# Patient Record
Sex: Male | Born: 1965 | Race: White | Hispanic: No | Marital: Married | Smoking: Former smoker
Health system: Southern US, Community
[De-identification: ages and names within clinical notes are randomized; demographics above are authoritative.]

## PROBLEM LIST (undated history)

## (undated) HISTORY — PX: HERNIA REPAIR: SHX51

---

## 2020-09-05 ENCOUNTER — Emergency Department: Payer: No Typology Code available for payment source

## 2020-09-05 ENCOUNTER — Emergency Department
Admission: EM | Admit: 2020-09-05 | Discharge: 2020-09-05 | Disposition: A | Discharge status: Home / Self Care | Payer: No Typology Code available for payment source | Attending: Emergency Medicine | Admitting: Emergency Medicine

## 2020-09-05 ENCOUNTER — Other Ambulatory Visit: Payer: Self-pay

## 2020-09-05 DIAGNOSIS — H532 Diplopia: Secondary | ICD-10-CM

## 2020-09-05 DIAGNOSIS — Z87891 Personal history of nicotine dependence: Secondary | ICD-10-CM | POA: Diagnosis not present

## 2020-09-05 DIAGNOSIS — R519 Headache, unspecified: Secondary | ICD-10-CM

## 2020-09-05 DIAGNOSIS — H538 Other visual disturbances: Secondary | ICD-10-CM | POA: Diagnosis not present

## 2020-09-05 DIAGNOSIS — H539 Unspecified visual disturbance: Secondary | ICD-10-CM

## 2020-09-05 DIAGNOSIS — R410 Disorientation, unspecified: Secondary | ICD-10-CM | POA: Diagnosis not present

## 2020-09-05 LAB — CBC
HCT: 41.2 % (ref 39.0–52.0)
Hemoglobin: 13.8 g/dL (ref 13.0–17.0)
MCH: 30.6 pg (ref 26.0–34.0)
MCHC: 33.5 g/dL (ref 30.0–36.0)
MCV: 91.4 fL (ref 80.0–100.0)
Platelets: 174 10*3/uL (ref 150–400)
RBC: 4.51 MIL/uL (ref 4.22–5.81)
RDW: 13.1 % (ref 11.5–15.5)
WBC: 4 10*3/uL (ref 4.0–10.5)
nRBC: 0 % (ref 0.0–0.2)

## 2020-09-05 LAB — DIFFERENTIAL
Abs Immature Granulocytes: 0.02 10*3/uL (ref 0.00–0.07)
Basophils Absolute: 0.1 10*3/uL (ref 0.0–0.1)
Basophils Relative: 2 %
Eosinophils Absolute: 0.1 10*3/uL (ref 0.0–0.5)
Eosinophils Relative: 3 %
Immature Granulocytes: 1 %
Lymphocytes Relative: 34 %
Lymphs Abs: 1.4 10*3/uL (ref 0.7–4.0)
Monocytes Absolute: 0.5 10*3/uL (ref 0.1–1.0)
Monocytes Relative: 13 %
Neutro Abs: 2 10*3/uL (ref 1.7–7.7)
Neutrophils Relative %: 47 %

## 2020-09-05 LAB — COMPREHENSIVE METABOLIC PANEL
ALT: 18 U/L (ref 0–44)
AST: 27 U/L (ref 15–41)
Albumin: 4.3 g/dL (ref 3.5–5.0)
Alkaline Phosphatase: 43 U/L (ref 38–126)
Anion gap: 8 (ref 5–15)
BUN: 13 mg/dL (ref 6–20)
CO2: 24 mmol/L (ref 22–32)
Calcium: 8.4 mg/dL — ABNORMAL LOW (ref 8.9–10.3)
Chloride: 102 mmol/L (ref 98–111)
Creatinine, Ser: 1.28 mg/dL — ABNORMAL HIGH (ref 0.61–1.24)
GFR, Estimated: >60 mL/min (ref >60)
Glucose, Bld: 77 mg/dL (ref 70–99)
Potassium: 3.9 mmol/L (ref 3.5–5.1)
Sodium: 134 mmol/L — ABNORMAL LOW (ref 135–145)
Total Bilirubin: 0.6 mg/dL (ref 0.3–1.2)
Total Protein: 7.2 g/dL (ref 6.5–8.1)

## 2020-09-05 LAB — CBG MONITORING, ED: Glucose-Capillary: 106 mg/dL — ABNORMAL HIGH (ref 70–99)

## 2020-09-05 LAB — APTT: aPTT: 29 seconds (ref 24–36)

## 2020-09-05 LAB — PROTIME-INR
INR: 1 (ref 0.8–1.2)
Prothrombin Time: 12.4 seconds (ref 11.4–15.2)

## 2020-09-05 MED ORDER — LORAZEPAM 2 MG/ML IJ SOLN
0.5000 mg | Freq: Once | INTRAMUSCULAR | Status: AC
  Administered 2020-09-05: 0.5 mg via INTRAVENOUS
  Filled 2020-09-05: qty 1

## 2020-09-05 MED ORDER — PROCHLORPERAZINE EDISYLATE 10 MG/2ML IJ SOLN
10.0000 mg | Freq: Once | INTRAMUSCULAR | Status: AC
  Administered 2020-09-05: 10 mg via INTRAVENOUS
  Filled 2020-09-05: qty 2

## 2020-09-05 MED ORDER — IOHEXOL 350 MG/ML SOLN
75.0000 mL | Freq: Once | INTRAVENOUS | Status: AC | PRN
  Administered 2020-09-05: 75 mL via INTRAVENOUS

## 2020-09-05 MED ORDER — DEXAMETHASONE SODIUM PHOSPHATE 10 MG/ML IJ SOLN
6.0000 mg | Freq: Once | INTRAMUSCULAR | Status: AC
  Administered 2020-09-05: 6 mg via INTRAVENOUS
  Filled 2020-09-05: qty 1

## 2020-09-05 MED ORDER — SODIUM CHLORIDE 0.9 % IV BOLUS
1000.0000 mL | Freq: Once | INTRAVENOUS | Status: AC
  Administered 2020-09-05: 1000 mL via INTRAVENOUS

## 2020-09-05 MED ORDER — SODIUM CHLORIDE 0.9% FLUSH
3.0000 mL | Freq: Once | INTRAVENOUS | Status: AC
Start: 2020-09-05 — End: 2020-09-05
  Administered 2020-09-05: 3 mL via INTRAVENOUS

## 2020-09-05 NOTE — ED Triage Notes (Addendum)
Pt via POV from home. Pt states he was driving with a sudden onset of double vision. R-sided facial numbness. No facial palsy or weakness on either side. Pt also c/o pressure in his R eye. LKW 1105. Denies pain or SOB. Pt is A&Ox4. Spoke with Dr. Darnelle Catalan, MD. CODE STROKE called.

## 2020-09-05 NOTE — Discharge Instructions (Addendum)
Please seek medical attention for any high fevers, chest pain, shortness of breath, change in behavior, persistent vomiting, bloody stool or any other new or concerning symptoms.  

## 2020-09-05 NOTE — Progress Notes (Signed)
   09/05/20 1209  Clinical Encounter Type  Visited With Patient  Visit Type Initial;Spiritual support;Social support  Referral From Nurse  Consult/Referral To Chaplain  Ch responded to Pg from ER. Pt was not in the room. Ch hung around to see if family was there. No family in hospital Ch will follow-up later.

## 2020-09-05 NOTE — ED Triage Notes (Signed)
FIRST NURSE: Pt reports sudden onset double vision that started while driving.  Pt states double vision has subsided, but that head now feels numb and he is having waves of dizziness.  Modified NIH in lobby is negative.

## 2020-09-05 NOTE — ED Notes (Signed)
Patient states he no longer has numbness on head. Just complains of slight headache.

## 2020-09-05 NOTE — ED Notes (Signed)
Code  stroke  called  to 333 

## 2020-09-05 NOTE — ED Provider Notes (Signed)
MRI negative for any acute abnormalities. Patient states that he feels better at this time. No longer having any vision change or headache. Will plan on discharge. Will give neurology follow up information.   Phineas Semen, MD 09/05/20 1626

## 2020-09-05 NOTE — ED Provider Notes (Signed)
[facility] Emergency Department Provider Note   ____________________________________________   First MD Initiated Contact with Patient 09/05/20 1229     (approximate)  I have reviewed the triage vital signs and the nursing notes.   HISTORY  Chief Complaint Code Stroke    HPI Douglas Schwartz is a 54 y.o. male who reports he was driving back to [place] with his wife.  He developed a headache and some right-sided facial funny feeling with double vision.  The double vision lasted about 2 minutes and resolved.  Patient did not have any trouble with moving his arms or legs or speaking.  He also had some pressure in his right eye.  On arrival here he did not have any double vision or any focal complaints except for the pressure behind his right eye and headache on the right side.  The headache was not the worst of his life.         History reviewed. No pertinent past medical history.  There are no problems to display for this patient.     Prior to Admission medications   Not on File    Allergies Patient has no known allergies.  No family history on file.  Social History Social History   Tobacco Use  . Smoking status: Former Smoker  Substance Use Topics  . Alcohol use: Not Currently  . Drug use: Never    Review of Systems  Constitutional: No fever/chills Eyes: Currently no visual changes. ENT: No sore throat. Cardiovascular: Denies chest pain. Respiratory: Denies shortness of breath. Gastrointestinal: No abdominal pain.  No nausea, no vomiting.  No diarrhea.  No constipation. Genitourinary: Negative for dysuria. Musculoskeletal: Negative for back pain. Skin: Negative for rash. Neurological: Negative for focal weakness   ____________________________________________   PHYSICAL EXAM:  VITAL SIGNS: ED Triage Vitals  Enc Vitals Group     BP 09/05/20 1200 134/87     Pulse Rate 09/05/20 1200 100     Resp 09/05/20 1200 18      Temp 09/05/20 1200 97.9 F (36.6 C)     Temp Source 09/05/20 1200 Oral     SpO2 09/05/20 1200 100 %     Weight 09/05/20 1230 203 lb 8 oz (92.3 kg)     Height 09/05/20 1230 6\' 2"  (1.88 m)     Head Circumference --      Peak Flow --      Pain Score 09/05/20 1225 0     Pain Loc --      Pain Edu? --      Excl. in GC? --     Constitutional: Alert and oriented. Well appearing and in no acute distress. Eyes: Conjunctivae are normal. PERRL. EOMI. fundi look normal Head: Atraumatic. Nose: No congestion/rhinnorhea. Mouth/Throat: Mucous membranes are moist.  Oropharynx non-erythematous. Neck: No stridor.   Cardiovascular: Normal rate, regular rhythm. Grossly normal heart sounds.  Good peripheral circulation. Respiratory: Normal respiratory effort.  No retractions. Lungs CTAB. Gastrointestinal: Soft and nontender. No distention. No abdominal bruits. No CVA tenderness. Musculoskeletal: No lower extremity tenderness nor edema.  Neurologic:  Normal speech and language. No gross focal neurologic deficits are appreciated.  Cranial nerves II through XII are intact although visual fields were not checked cerebellar finger-nose heel-to-shin and rapid alternating movements and hands are normal motor strength is 5/5 throughout patient does not report any numbness Skin:  Skin is warm, dry and intact. No rash noted.   ____________________________________________   LABS (all labs ordered  are listed, but only abnormal results are displayed)  Labs Reviewed  COMPREHENSIVE METABOLIC PANEL - Abnormal; Notable for the following components:      Result Value   Sodium 134 (*)    Creatinine, Ser 1.28 (*)    Calcium 8.4 (*)    All other components within normal limits  CBG MONITORING, ED - Abnormal; Notable for the following components:   Glucose-Capillary 106 (*)    All other components within normal limits  PROTIME-INR  APTT  CBC  DIFFERENTIAL  I-STAT CREATININE, ED  CBG MONITORING, ED    ____________________________________________  EKG EKG read interpreted by me shows normal sinus rhythm rate of 72 right bundle branch block no acute disease  ____________________________________________  RADIOLOGY Jill Poling, personally viewed and evaluated these images (plain radiographs) as part of my medical decision making, as well as reviewing the written report by the radiologist.  ED MD interpretation: CT and CT angio of the head were read as no acute disease by radiology I reviewed the films  Official radiology report(s): CT Angio Head W or Wo Contrast  Result Date: 09/05/2020 CLINICAL DATA:  Sudden onset of double vision and right-sided facial numbness. EXAM: CT ANGIOGRAPHY HEAD AND NECK TECHNIQUE: Multidetector CT imaging of the head and neck was performed using the standard protocol during bolus administration of intravenous contrast. Multiplanar CT image reconstructions and MIPs were obtained to evaluate the vascular anatomy. Carotid stenosis measurements (when applicable) are obtained utilizing NASCET criteria, using the distal internal carotid diameter as the denominator. CONTRAST:  47mL OMNIPAQUE IOHEXOL 350 MG/ML SOLN COMPARISON:  Head CT earlier same day FINDINGS: CTA NECK FINDINGS Aortic arch: Normal Right carotid system: Normal.  No bifurcation disease. Left carotid system: Normal.  No bifurcation disease. Vertebral arteries: Normal. No origin stenosis. No evidence of dissection. Left vertebral artery arises directly from the arch. Skeleton: Normal Other neck: No mass or adenopathy. Upper chest: Normal Review of the MIP images confirms the above findings CTA HEAD FINDINGS Anterior circulation: Both internal carotid arteries widely patent through the skull base and siphon regions. The anterior and middle cerebral vessels are patent without proximal stenosis, aneurysm or vascular malformation. No large or medium vessel occlusion. Posterior circulation: Both vertebral  arteries widely patent to the basilar. No basilar stenosis. Posterior circulation branch vessels are normal. Left PCA takes fetal origin from the anterior circulation. Venous sinuses: Patent and normal. Anatomic variants: None significant. Review of the MIP images confirms the above findings IMPRESSION: Normal examination. No atherosclerotic disease. No large or medium vessel occlusion. These results were called by telephone at the time of interpretation on 09/05/2020 at 12:22 pm to provider Dr. Darnelle Catalan, who verbally acknowledged these results. Electronically Signed   By: Paulina Fusi M.D.   On: 09/05/2020 12:35   CT Angio Neck W and/or Wo Contrast  Result Date: 09/05/2020 CLINICAL DATA:  Sudden onset of double vision and right-sided facial numbness. EXAM: CT ANGIOGRAPHY HEAD AND NECK TECHNIQUE: Multidetector CT imaging of the head and neck was performed using the standard protocol during bolus administration of intravenous contrast. Multiplanar CT image reconstructions and MIPs were obtained to evaluate the vascular anatomy. Carotid stenosis measurements (when applicable) are obtained utilizing NASCET criteria, using the distal internal carotid diameter as the denominator. CONTRAST:  14mL OMNIPAQUE IOHEXOL 350 MG/ML SOLN COMPARISON:  Head CT earlier same day FINDINGS: CTA NECK FINDINGS Aortic arch: Normal Right carotid system: Normal.  No bifurcation disease. Left carotid system: Normal.  No bifurcation disease.  Vertebral arteries: Normal. No origin stenosis. No evidence of dissection. Left vertebral artery arises directly from the arch. Skeleton: Normal Other neck: No mass or adenopathy. Upper chest: Normal Review of the MIP images confirms the above findings CTA HEAD FINDINGS Anterior circulation: Both internal carotid arteries widely patent through the skull base and siphon regions. The anterior and middle cerebral vessels are patent without proximal stenosis, aneurysm or vascular malformation. No large or  medium vessel occlusion. Posterior circulation: Both vertebral arteries widely patent to the basilar. No basilar stenosis. Posterior circulation branch vessels are normal. Left PCA takes fetal origin from the anterior circulation. Venous sinuses: Patent and normal. Anatomic variants: None significant. Review of the MIP images confirms the above findings IMPRESSION: Normal examination. No atherosclerotic disease. No large or medium vessel occlusion. These results were called by telephone at the time of interpretation on 09/05/2020 at 12:22 pm to provider Dr. Darnelle CatalanMalinda, who verbally acknowledged these results. Electronically Signed   By: Paulina FusiMark  Shogry M.D.   On: 09/05/2020 12:35   MR BRAIN WO CONTRAST  Result Date: 09/05/2020 CLINICAL DATA:  Sudden onset of confusion, blurry vision, and headache. EXAM: MRI HEAD WITHOUT CONTRAST TECHNIQUE: Multiplanar, multiecho pulse sequences of the brain and surrounding structures were obtained without intravenous contrast. COMPARISON:  Head CT and CTA 09/05/2020 FINDINGS: Brain: There is no evidence of an acute infarct, intracranial hemorrhage, mass, midline shift, or extra-axial fluid collection. The ventricles and sulci are within normal limits for age. The brain is normal in signal. Vascular: Major intracranial vascular flow voids are preserved. Skull and upper cervical spine: Unremarkable bone marrow signal. Sinuses/Orbits: Unremarkable orbits. Paranasal sinuses and mastoid air cells are clear. Other: None. IMPRESSION: Negative brain MRI. Electronically Signed   By: Sebastian AcheAllen  Grady M.D.   On: 09/05/2020 15:28   CT HEAD CODE STROKE WO CONTRAST  Result Date: 09/05/2020 CLINICAL DATA:  Code stroke. Sudden onset of double vision in right facial numbness while driving. EXAM: CT HEAD WITHOUT CONTRAST TECHNIQUE: Contiguous axial images were obtained from the base of the skull through the vertex without intravenous contrast. COMPARISON:  None. FINDINGS: Brain: The brain has normal  appearance without evidence of old or acute infarction, mass lesion, hemorrhage, hydrocephalus or extra-axial collection. Vascular: No abnormal vascular finding. Skull: Negative Sinuses/Orbits: Clear/normal Other: None ASPECTS (Alberta Stroke Program Early CT Score) - Ganglionic level infarction (caudate, lentiform nuclei, internal capsule, insula, M1-M3 cortex): 7 - Supraganglionic infarction (M4-M6 cortex): 3 Total score (0-10 with 10 being normal): 10 IMPRESSION: 1. Normal head CT. 2. ASPECTS is 10. 3. These results were called by telephone at the time of interpretation on 09/05/2020 at 12:23 pm to provider [facility] K[facility]ss Frese , who verbally acknowledged these results. Electronically Signed   By: Paulina FusiMark  Shogry M.D.   On: 09/05/2020 12:29    ____________________________________________   PROCEDURES  Procedure(s) performed (including Critical Care):  Procedures   ____________________________________________   INITIAL IMPRESSION / ASSESSMENT AND PLAN / ED COURSE  Since code stroke was called Dr. Earlean PolkaSolik when came down to see the patient.  He evaluated the patient felt that this was not a stroke and if the MRI was negative the patient could go home. Patient signed out to oncoming physician pending MRI report             ____________________________________________   FINAL CLINICAL IMPRESSION(S) / ED DIAGNOSES  Final diagnoses:  Vision changes  Nonintractable headache, unspecified chronicity pattern, unspecified headache type  Episode of confusion     ED Discharge  Orders    None      *Please note:  NAOMI FITTON was evaluated in Emergency Department on 09/05/2020 for the symptoms described in the history of present illness. He was evaluated in the context of the global COVID-19 pandemic, which necessitated consideration that the patient might be at risk for infection with the SARS-CoV-2 virus that causes COVID-19. Institutional protocols and algorithms that pertain to the  evaluation of patients at risk for COVID-19 are in a state of rapid change based on information released by regulatory bodies including the CDC and federal and state organizations. These policies and algorithms were followed during the patient's care in the ED.  Some ED evaluations and interventions may be delayed as a result of limited staffing during and the pandemic.*   Note:  This document was prepared using Dragon voice recognition software and may include unintentional dictation errors.    Arnaldo Natal, MD 09/05/20 1630

## 2020-09-05 NOTE — Progress Notes (Signed)
CODE STROKE- PHARMACY COMMUNICATION   Time CODE STROKE called/page received:1211  Time response to CODE STROKE was made in person: 1225  Time Stroke Kit retrieved from [place] (only if needed):N/A  Name of Provider/Nurse contacted:Olivia, RN  Renda Rolls, PharmD, [facility] 09/05/2020 12:41 PM

## 2020-09-05 NOTE — Consult Note (Signed)
Reason for Consult: headache, double vision  Requesting Physician: Dr. Darnelle Catalan   CC: headache, double vision     HPI: Douglas Schwartz is an 54 y.o. male wit no past medical hx and is not on any medications. Pt was driving with his wife from [place] to [place] and at 11 AM had period of confusion, blurry vision and pressure like headache that is unusual for him. No focal deficits seen on exam.   History reviewed. No pertinent past medical history.  No family history on file.  Social History:  reports that he has quit smoking. He does not have any smokeless tobacco history on file. He reports previous alcohol use. He reports that he does not use drugs.  No Known Allergies  Medications: I have reviewed the patient's current medications.  ROS: History obtained from the patient  General ROS: negative for - chills, fatigue, fever, night sweats, weight gain or weight loss Psychological ROS: negative for - behavioral disorder, hallucinations, memory difficulties, mood swings or suicidal ideation Ophthalmic ROS: negative for - blurry vision, double vision, eye pain or loss of vision ENT ROS: negative for - epistaxis, nasal discharge, oral lesions, sore throat, tinnitus or vertigo Allergy and Immunology ROS: negative for - hives or itchy/watery eyes Hematological and Lymphatic ROS: negative for - bleeding problems, bruising or swollen lymph nodes Endocrine ROS: negative for - galactorrhea, hair pattern changes, polydipsia/polyuria or temperature intolerance Respiratory ROS: negative for - cough, hemoptysis, shortness of breath or wheezing Cardiovascular ROS: negative for - chest pain, dyspnea on exertion, edema or irregular heartbeat Gastrointestinal ROS: negative for - abdominal pain, diarrhea, hematemesis, nausea/vomiting or stool incontinence Genito-Urinary ROS: negative for - dysuria, hematuria, incontinence or urinary frequency/urgency Musculoskeletal ROS: negative for - joint  swelling or muscular weakness Neurological ROS: as noted in HPI Dermatological ROS: negative for rash and skin lesion changes  Physical Examination: Height 6\' 2"  (1.88 m), weight 92.3 kg.    Neurological Examination   Mental Status: Alert, oriented, thought content appropriate.  Speech fluent without evidence of aphasia.  Able to follow 3 step commands without difficulty. Cranial Nerves: II: Discs flat bilaterally; Visual fields grossly normal, pupils equal, round, reactive to light and accommodation III,IV, VI: ptosis not present, extra-ocular motions intact bilaterally V,VII: smile symmetric, facial light touch sensation normal bilaterally VIII: hearing normal bilaterally IX,X: gag reflex present XI: bilateral shoulder shrug XII: midline tongue extension Motor: Right : Upper extremity   5/5    Left:     Upper extremity   5/5  Lower extremity   5/5     Lower extremity   5/5 Tone and bulk:normal tone throughout; no atrophy noted Sensory: Pinprick and light touch intact throughout, bilaterally Deep Tendon Reflexes: 2+ and symmetric throughout Plantars: Right: downgoing   Left: downgoing Cerebellar: normal finger-to-nose, normal rapid alternating movements and normal heel-to-shin test       Laboratory Studies:   Basic Metabolic Panel: No results for input(s): NA, K, CL, CO2, GLUCOSE, BUN, CREATININE, CALCIUM, MG, PHOS in the last 168 hours.  Liver Function Tests: No results for input(s): AST, ALT, ALKPHOS, BILITOT, PROT, ALBUMIN in the last 168 hours. No results for input(s): LIPASE, AMYLASE in the last 168 hours. No results for input(s): AMMONIA in the last 168 hours.  CBC: Recent Labs  Lab 09/05/20 1232  WBC 4.0  NEUTROABS 2.0  HGB 13.8  HCT 41.2  MCV 91.4  PLT 174    Cardiac Enzymes: No results for input(s): CKTOTAL, CKMB, CKMBINDEX, TROPONINI  in the last 168 hours.  BNP: Invalid input(s): POCBNP  CBG: Recent Labs  Lab 09/05/20 1209  GLUCAP 106*     Microbiology: No results found for this or any previous visit.  Coagulation Studies: No results for input(s): LABPROT, INR in the last 72 hours.  Urinalysis: No results for input(s): COLORURINE, LABSPEC, PHURINE, GLUCOSEU, HGBUR, BILIRUBINUR, KETONESUR, PROTEINUR, UROBILINOGEN, NITRITE, LEUKOCYTESUR in the last 168 hours.  Invalid input(s): APPERANCEUR  Lipid Panel:  No results found for: CHOL, TRIG, HDL, CHOLHDL, VLDL, LDLCALC  HgbA1C: No results found for: HGBA1C  Urine Drug Screen:  No results found for: LABOPIA, COCAINSCRNUR, LABBENZ, AMPHETMU, THCU, LABBARB  Alcohol Level: No results for input(s): ETH in the last 168 hours.  Other results: EKG: normal EKG, normal sinus rhythm, unchanged from previous tracings.  Imaging: CT Angio Head W or Wo Contrast  Result Date: 09/05/2020 CLINICAL DATA:  Sudden onset of double vision and right-sided facial numbness. EXAM: CT ANGIOGRAPHY HEAD AND NECK TECHNIQUE: Multidetector CT imaging of the head and neck was performed using the standard protocol during bolus administration of intravenous contrast. Multiplanar CT image reconstructions and MIPs were obtained to evaluate the vascular anatomy. Carotid stenosis measurements (when applicable) are obtained utilizing NASCET criteria, using the distal internal carotid diameter as the denominator. CONTRAST:  55mL OMNIPAQUE IOHEXOL 350 MG/ML SOLN COMPARISON:  Head CT earlier same day FINDINGS: CTA NECK FINDINGS Aortic arch: Normal Right carotid system: Normal.  No bifurcation disease. Left carotid system: Normal.  No bifurcation disease. Vertebral arteries: Normal. No origin stenosis. No evidence of dissection. Left vertebral artery arises directly from the arch. Skeleton: Normal Other neck: No mass or adenopathy. Upper chest: Normal Review of the MIP images confirms the above findings CTA HEAD FINDINGS Anterior circulation: Both internal carotid arteries widely patent through the skull base and siphon  regions. The anterior and middle cerebral vessels are patent without proximal stenosis, aneurysm or vascular malformation. No large or medium vessel occlusion. Posterior circulation: Both vertebral arteries widely patent to the basilar. No basilar stenosis. Posterior circulation branch vessels are normal. Left PCA takes fetal origin from the anterior circulation. Venous sinuses: Patent and normal. Anatomic variants: None significant. Review of the MIP images confirms the above findings IMPRESSION: Normal examination. No atherosclerotic disease. No large or medium vessel occlusion. These results were called by telephone at the time of interpretation on 09/05/2020 at 12:22 pm to provider Dr. Darnelle Catalan, who verbally acknowledged these results. Electronically Signed   By: Paulina Fusi M.D.   On: 09/05/2020 12:35   CT Angio Neck W and/or Wo Contrast  Result Date: 09/05/2020 CLINICAL DATA:  Sudden onset of double vision and right-sided facial numbness. EXAM: CT ANGIOGRAPHY HEAD AND NECK TECHNIQUE: Multidetector CT imaging of the head and neck was performed using the standard protocol during bolus administration of intravenous contrast. Multiplanar CT image reconstructions and MIPs were obtained to evaluate the vascular anatomy. Carotid stenosis measurements (when applicable) are obtained utilizing NASCET criteria, using the distal internal carotid diameter as the denominator. CONTRAST:  68mL OMNIPAQUE IOHEXOL 350 MG/ML SOLN COMPARISON:  Head CT earlier same day FINDINGS: CTA NECK FINDINGS Aortic arch: Normal Right carotid system: Normal.  No bifurcation disease. Left carotid system: Normal.  No bifurcation disease. Vertebral arteries: Normal. No origin stenosis. No evidence of dissection. Left vertebral artery arises directly from the arch. Skeleton: Normal Other neck: No mass or adenopathy. Upper chest: Normal Review of the MIP images confirms the above findings CTA HEAD FINDINGS Anterior circulation: Both  internal  carotid arteries widely patent through the skull base and siphon regions. The anterior and middle cerebral vessels are patent without proximal stenosis, aneurysm or vascular malformation. No large or medium vessel occlusion. Posterior circulation: Both vertebral arteries widely patent to the basilar. No basilar stenosis. Posterior circulation branch vessels are normal. Left PCA takes fetal origin from the anterior circulation. Venous sinuses: Patent and normal. Anatomic variants: None significant. Review of the MIP images confirms the above findings IMPRESSION: Normal examination. No atherosclerotic disease. No large or medium vessel occlusion. These results were called by telephone at the time of interpretation on 09/05/2020 at 12:22 pm to provider Dr. Darnelle Catalan, who verbally acknowledged these results. Electronically Signed   By: Paulina Fusi M.D.   On: 09/05/2020 12:35   CT HEAD CODE STROKE WO CONTRAST  Result Date: 09/05/2020 CLINICAL DATA:  Code stroke. Sudden onset of double vision in right facial numbness while driving. EXAM: CT HEAD WITHOUT CONTRAST TECHNIQUE: Contiguous axial images were obtained from the base of the skull through the vertex without intravenous contrast. COMPARISON:  None. FINDINGS: Brain: The brain has normal appearance without evidence of old or acute infarction, mass lesion, hemorrhage, hydrocephalus or extra-axial collection. Vascular: No abnormal vascular finding. Skull: Negative Sinuses/Orbits: Clear/normal Other: None ASPECTS (Alberta Stroke Program Early CT Score) - Ganglionic level infarction (caudate, lentiform nuclei, internal capsule, insula, M1-M3 cortex): 7 - Supraganglionic infarction (M4-M6 cortex): 3 Total score (0-10 with 10 being normal): 10 IMPRESSION: 1. Normal head CT. 2. ASPECTS is 10. 3. These results were called by telephone at the time of interpretation on 09/05/2020 at 12:23 pm to provider [facility] , who verbally acknowledged these results. Electronically  Signed   By: Paulina Fusi M.D.   On: 09/05/2020 12:29     Assessment/Plan:  54 y.o. male wit no past medical hx and is not on any medications. Pt was driving with his wife from [place] to [place] and at 11 AM had period of confusion, blurry vision and pressure like headache that is unusual for him. No focal deficits seen on exam.   - Currently improved and he appears close to baseline - possibly headache related - no clear visual deficits on examination  - NIHSS of 0 - no TPA - CTH and CTA no acute abnormalities - MRI brain diffusion sequence only - decadron 6mg  IV once - if improves and negative MRI I think he can be discharged today 09/05/2020, 12:47 PM

## 2020-09-05 NOTE — ED Notes (Signed)
Neurologist in CT at this time.

## 2021-06-19 IMAGING — MR MR HEAD W/O CM
11 series · 43 of 48 positions shown · non-contrast
Comparison: Head CT and CTA 09/05/2020

CLINICAL DATA: Sudden onset of confusion, blurry vision, and
headache.

EXAM:
MRI HEAD WITHOUT CONTRAST
TECHNIQUE: Multiplanar, multiecho pulse sequences of the brain and surrounding
structures were obtained without intravenous contrast.

[Series 5: ax dwi_tracew · axial · 3.0mm · 0.60mm/px · z∈[-52,+102]mm · 4 of 48 slices shown]
[im 1/48]
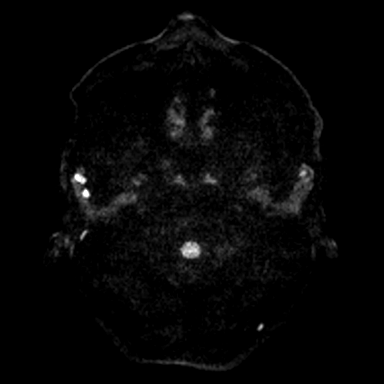
[im 16/48]
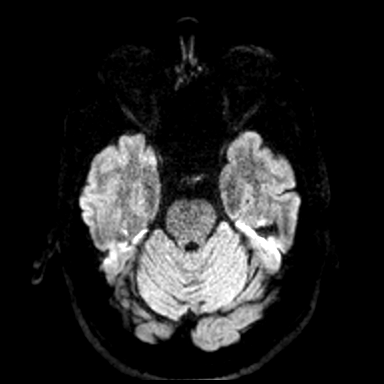
[im 32/48]
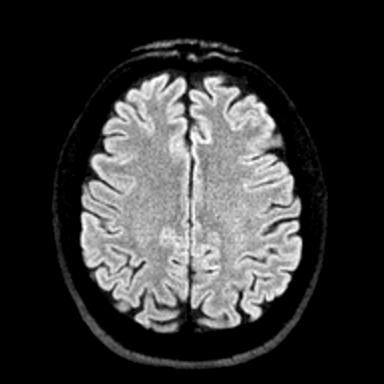
[im 48/48]
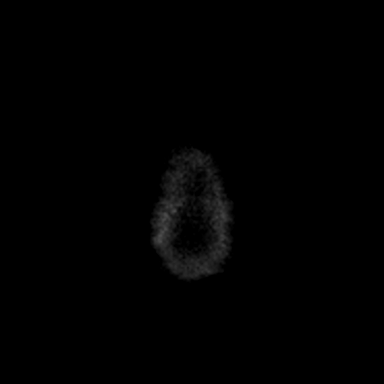

[Series 6: ax dwi_adc · axial · 3.0mm · 0.60mm/px · z∈[-52,+102]mm · 4 of 48 slices shown]
[im 1/48]
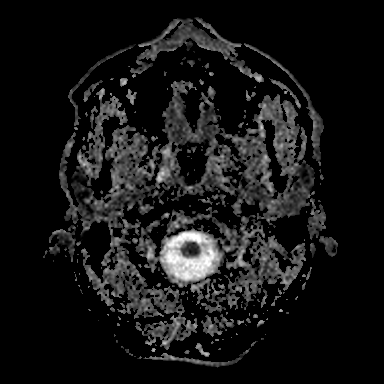
[im 16/48]
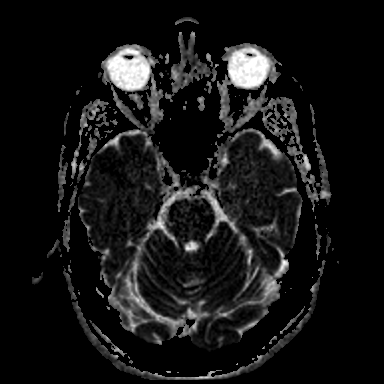
[im 32/48]
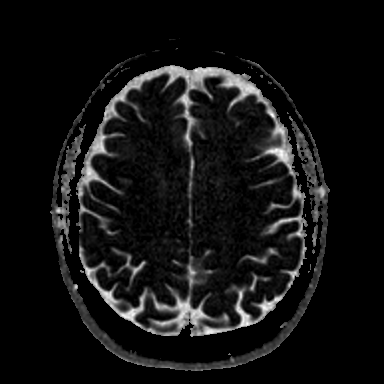
[im 48/48]
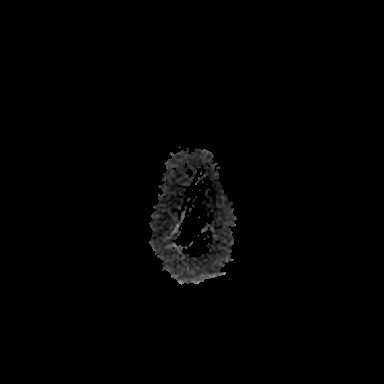

[Series 7: cor dwi_tracew · coronal · 5.0mm · 0.60mm/px · 3 of 40 slices shown]
[im 1/40]
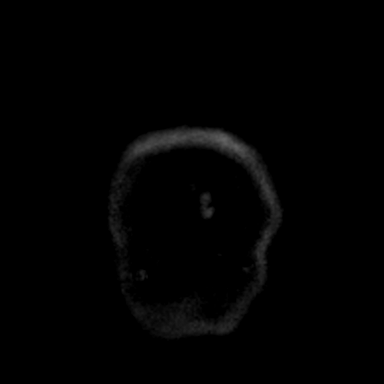
[im 20/40]
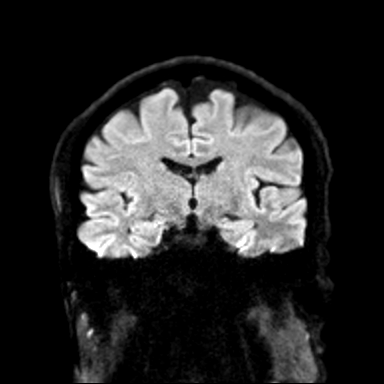
[im 40/40]
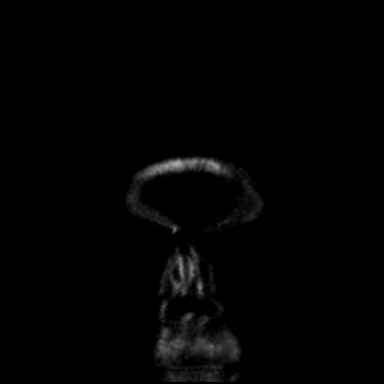

[Series 8: cor dwi_adc · coronal · 5.0mm · 0.60mm/px · 3 of 40 slices shown]
[im 1/40]
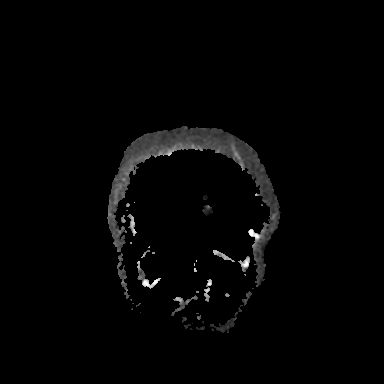
[im 20/40]
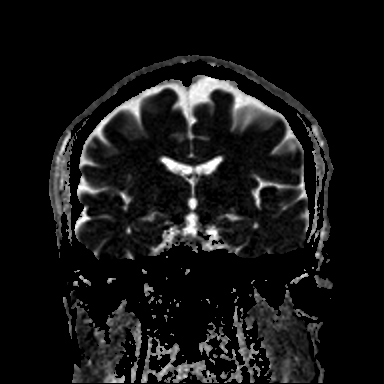
[im 40/40]
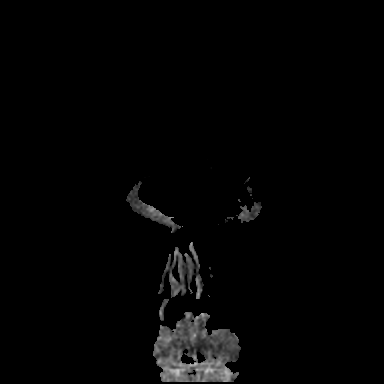

[Series 9: FLAIR · axial · 3.0mm · 0.53mm/px · z∈[-56,+104]mm · 4 of 55 slices shown]
[im 1/55]
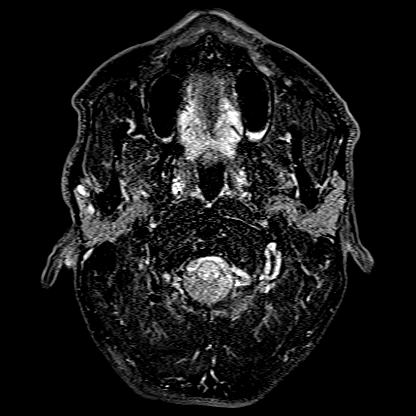
[im 19/55]
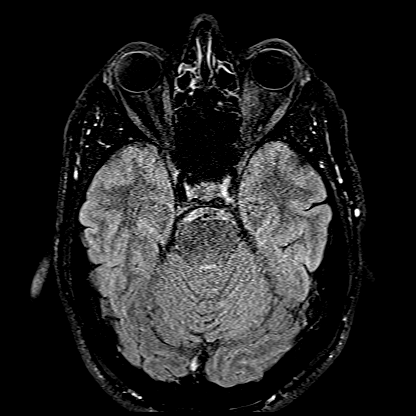
[im 37/55]
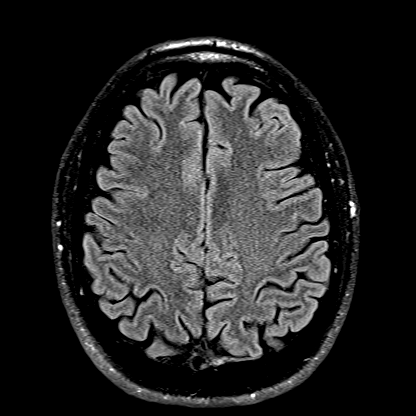
[im 55/55]
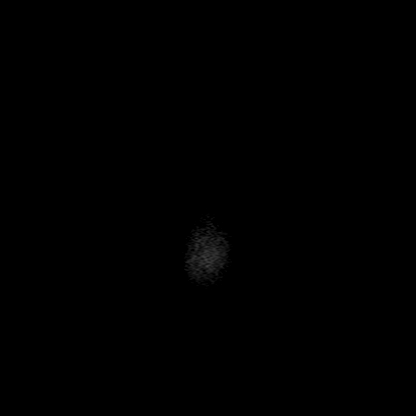

[Series 11: pha_images · axial · 3.0mm · 0.90mm/px · z∈[-63,+112]mm · 5 of 59 slices shown]
[im 1/59]
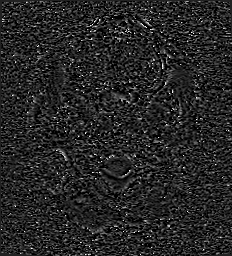
[im 15/59]
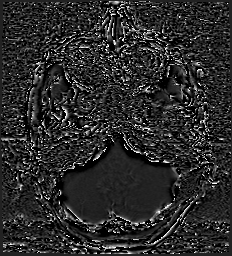
[im 30/59]
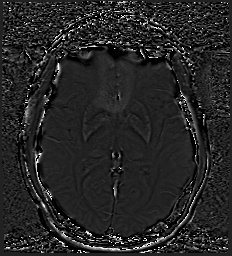
[im 44/59]
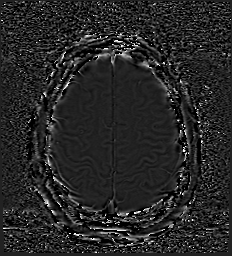
[im 59/59]
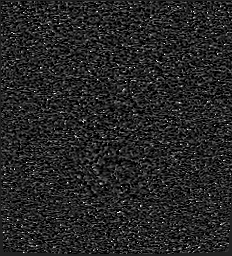

[Series 12: swi_images · axial · 3.0mm · 0.90mm/px · z∈[-63,+112]mm · 5 of 60 slices shown]
[im 1/60]
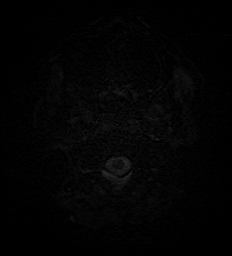
[im 15/60]
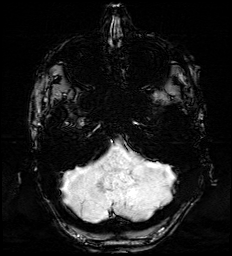
[im 30/60]
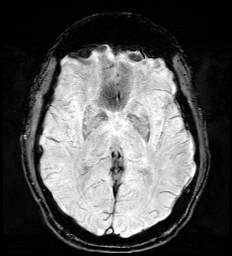
[im 45/60]
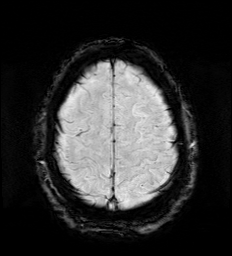
[im 60/60]
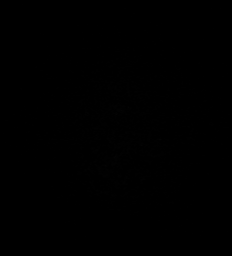

[Series 14: T1 · sagittal · 5.0mm · 0.94mm/px · 2 of 23 slices shown (1 of 2)]
[im 1/23]
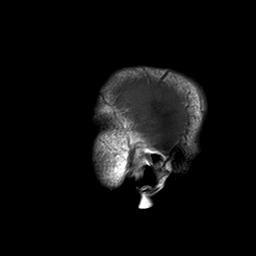
[im 23/23]
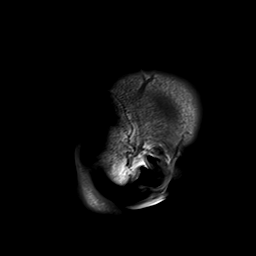

[Series 15: T2 · axial · 5.0mm · 0.45mm/px · z∈[-53,+102]mm · 2 of 27 slices shown (1 of 2)]
[im 1/27]
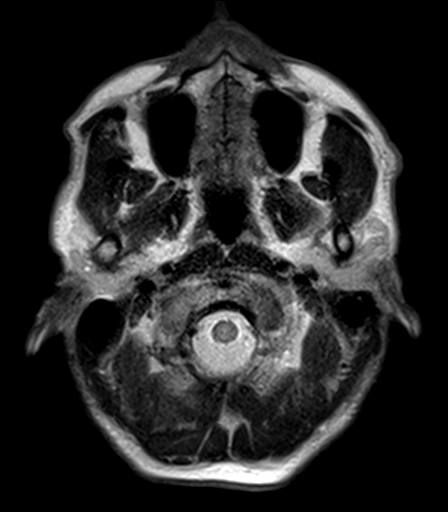
[im 27/27]
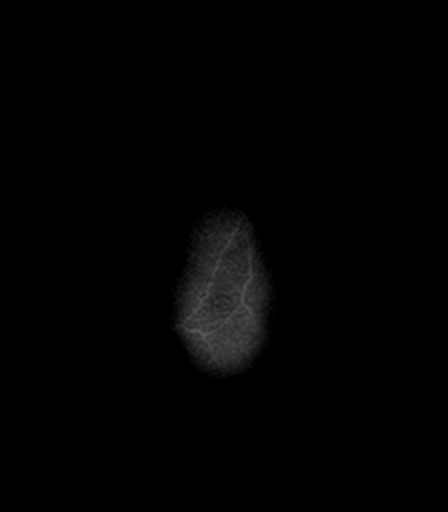

[Series 16: T1 · axial · 1.0mm · 0.98mm/px · z∈[-60,+113]mm · 9 of 176 slices shown (2 of 2)]
[im 1/176]
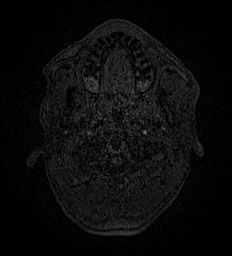
[im 14/176]
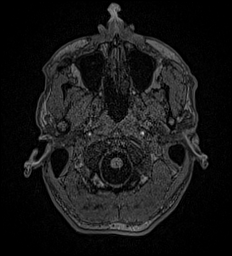
[im 27/176]
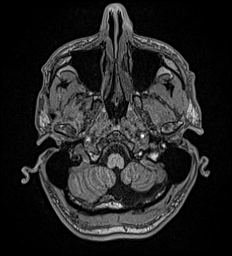
[im 54/176]
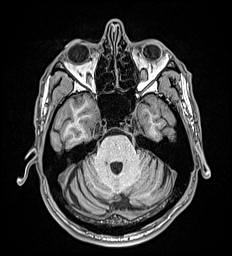
[im 81/176]
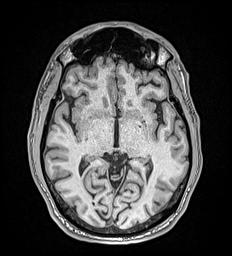
[im 95/176]
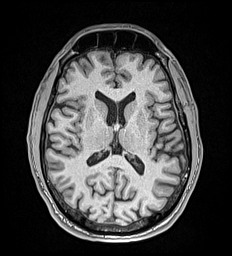
[im 122/176]
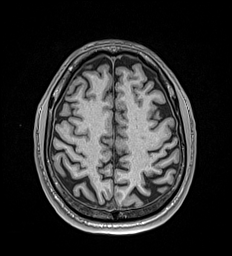
[im 149/176]
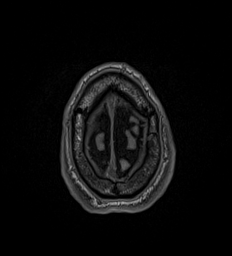
[im 176/176]
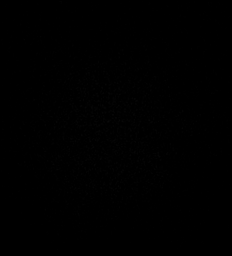

[Series 17: T2 · coronal · 5.0mm · 0.45mm/px · 2 of 31 slices shown (2 of 2)]
[im 1/31]
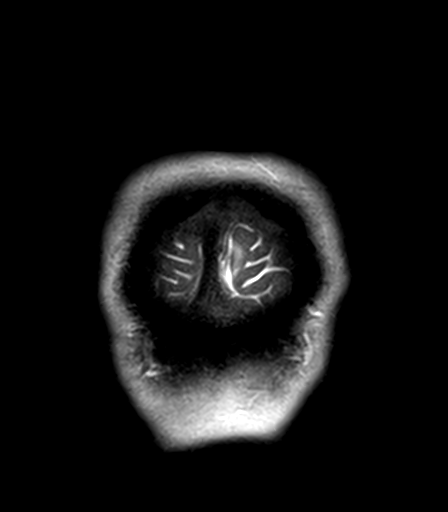
[im 31/31]
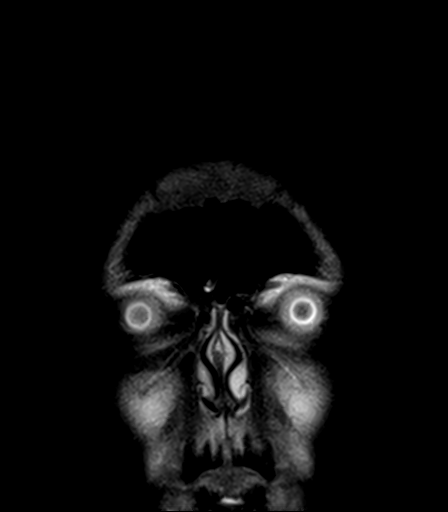

[43 of 48 positions shown; findings below may reference images not displayed]

FINDINGS: Brain: There is no evidence of an acute infarct, intracranial
hemorrhage, mass, midline shift, or extra-axial fluid collection.
The ventricles and sulci are within normal limits for age. The brain
is normal in signal.

Vascular: Major intracranial vascular flow voids are preserved.

Skull and upper cervical spine: Unremarkable bone marrow signal.

Sinuses/Orbits: Unremarkable orbits. Paranasal sinuses and mastoid
air cells are clear.

Other: None.
IMPRESSION: Negative brain MRI.

## 2021-06-19 IMAGING — CT CT ANGIO HEAD
3 of 7 series · 14 of 47 positions shown · IV contrast (omnipaque)
Comparison: Head CT earlier same day

CLINICAL DATA: Sudden onset of double vision and right-sided facial
numbness.

EXAM:
CT ANGIOGRAPHY HEAD AND NECK
TECHNIQUE: Multidetector CT imaging of the head and neck was performed using
the standard protocol during bolus administration of intravenous
contrast. Multiplanar CT image reconstructions and MIPs were
obtained to evaluate the vascular anatomy. Carotid stenosis
measurements (when applicable) are obtained utilizing NASCET
criteria, using the distal internal carotid diameter as the
denominator.
CONTRAST:  75mL OMNIPAQUE IOHEXOL 350 MG/ML SOLN

[Series 6: ax thin · axial · 0.46mm/px · z∈[-349,-25]mm · 8 of 395 slices shown]
[im 47/395  brain]
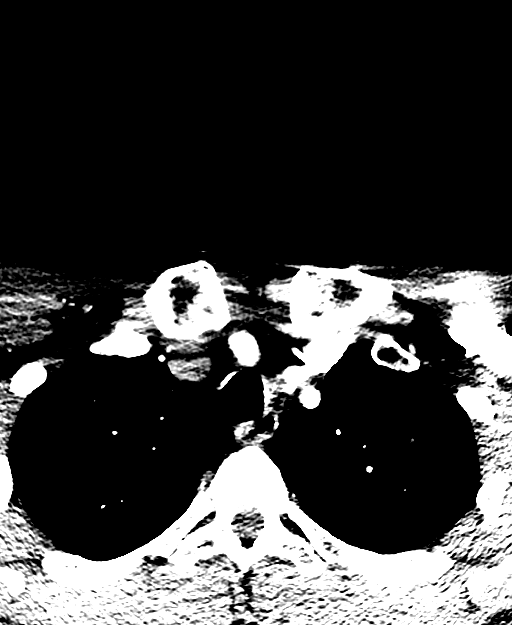
[im 93/395  bone]
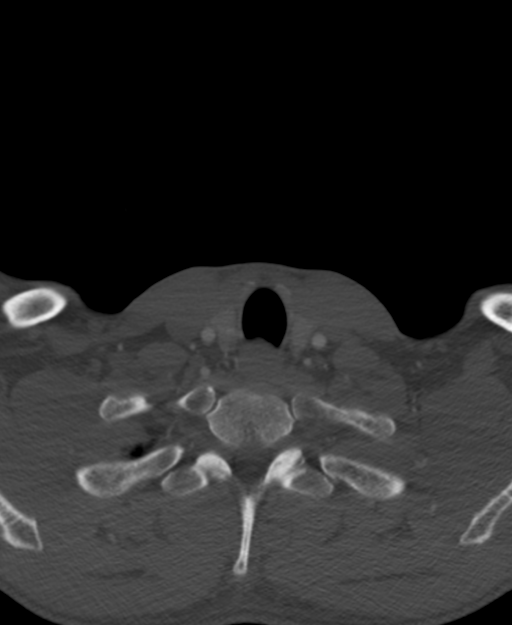
[im 140/395  brain]
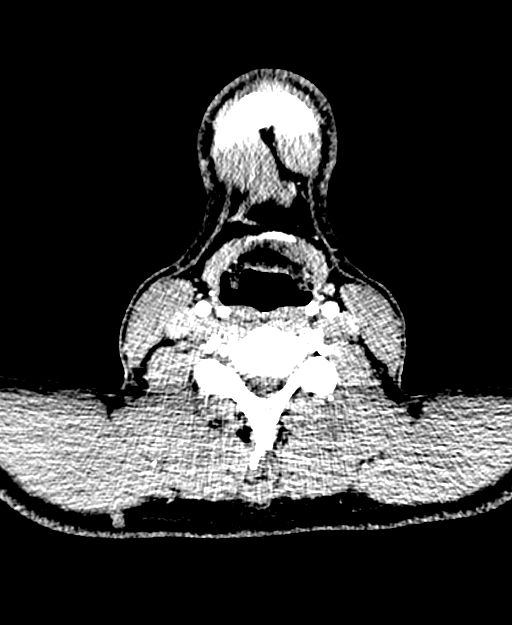
[im 186/395  bone]
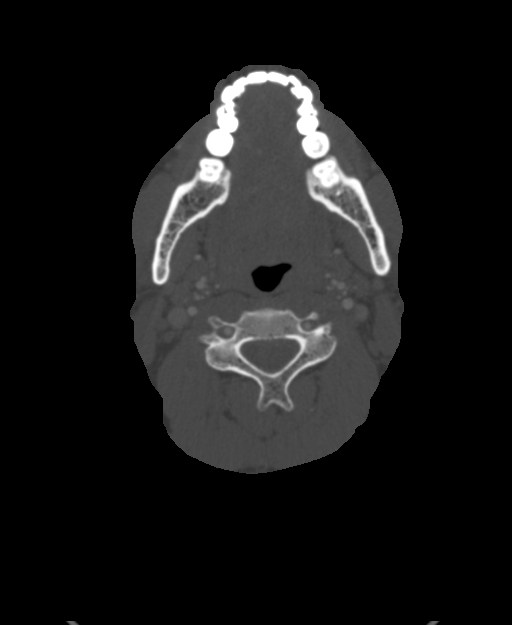
[im 232/395  brain]
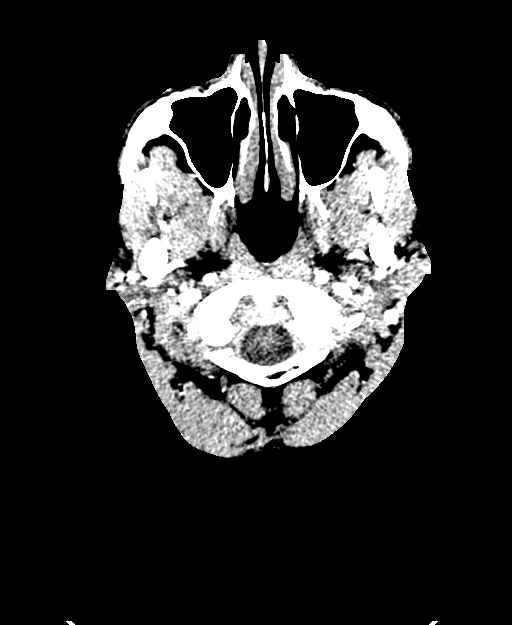
[im 279/395  bone]
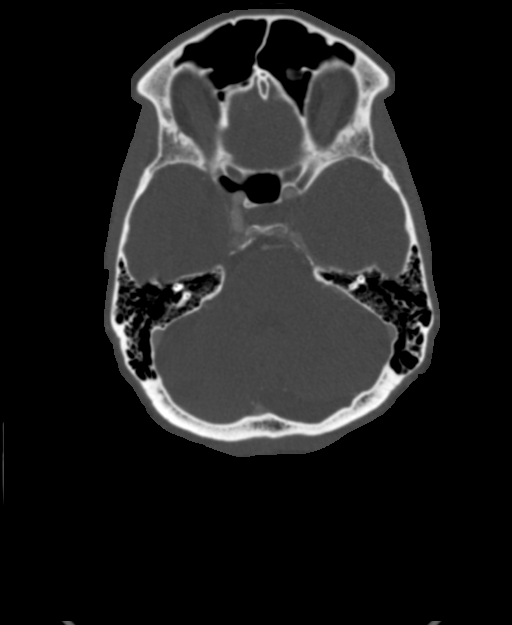
[im 325/395  brain]
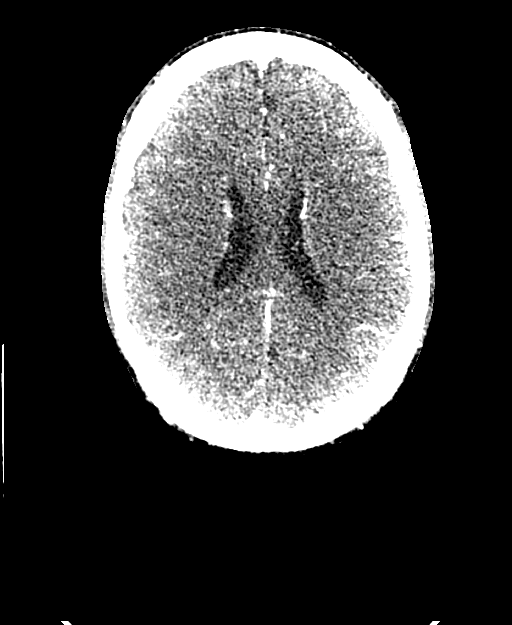
[im 371/395  bone]
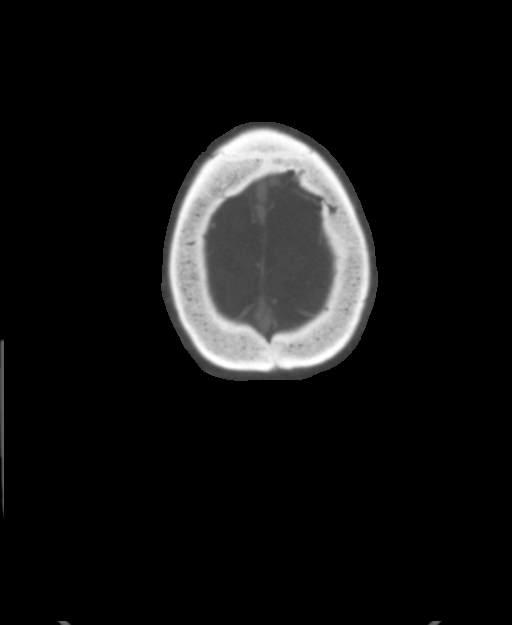

[Series 7: coronal thin · coronal · 0.46mm/px · 3 of 289 slices shown]
[im 83/289  brain]
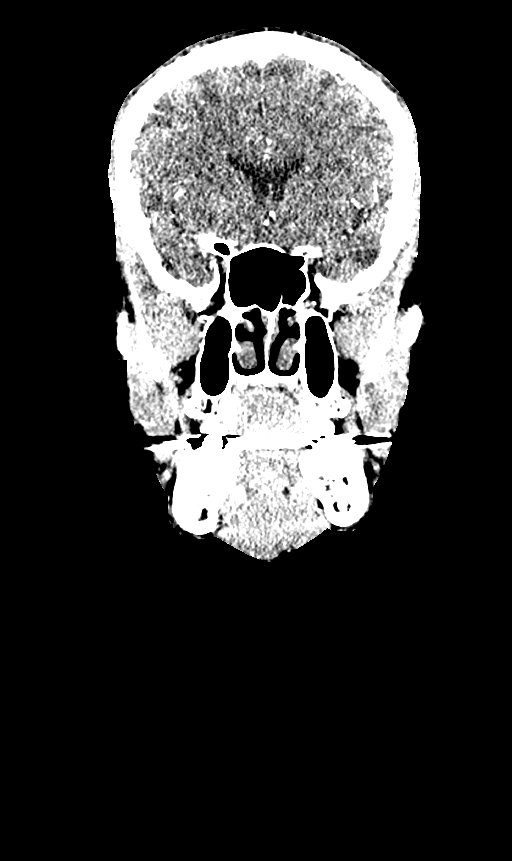
[im 124/289  brain]
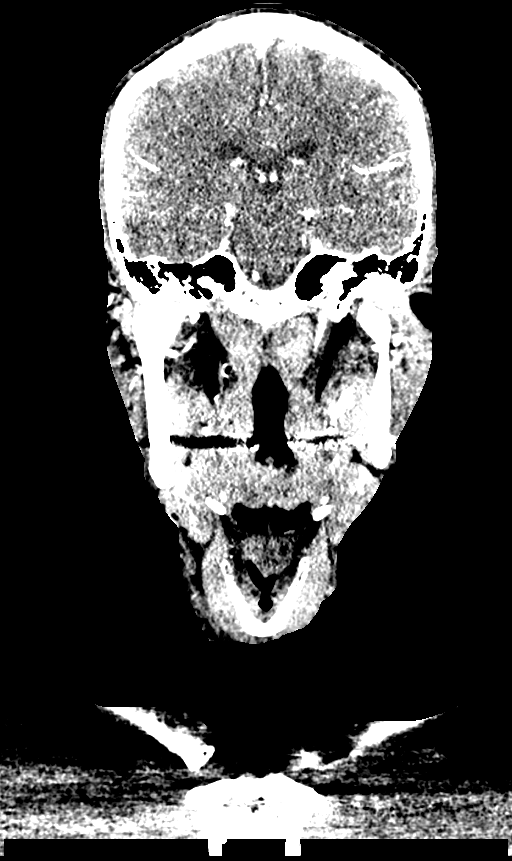
[im 165/289  brain]
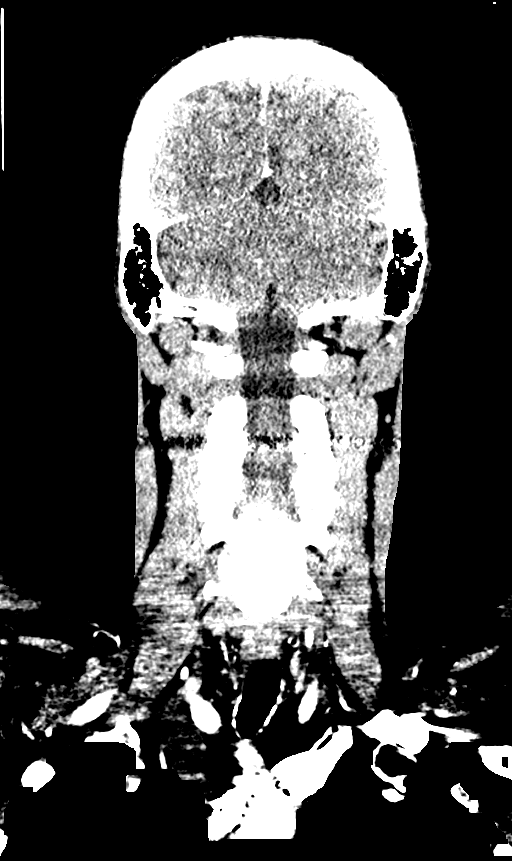

[Series 8: sagittal thin · sagittal · 0.56mm/px · 3 of 237 slices shown]
[im 48/237  brain]
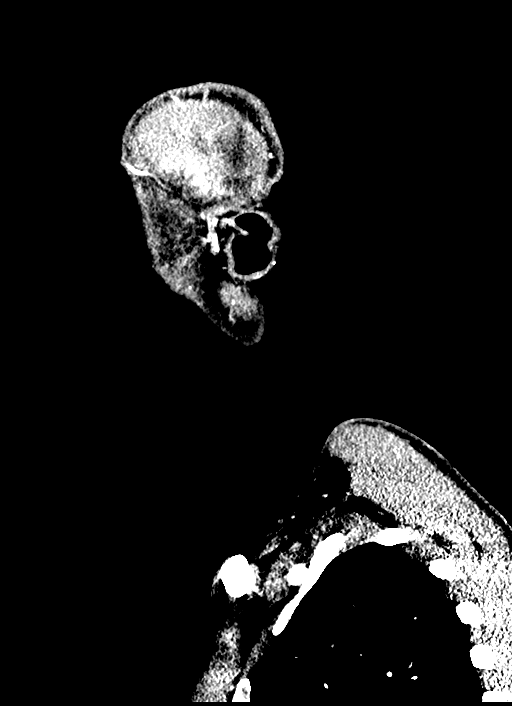
[im 95/237  brain]
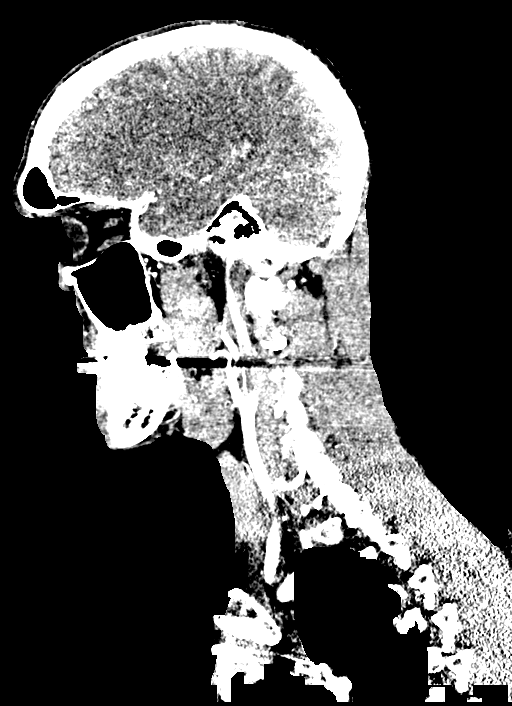
[im 142/237  brain]
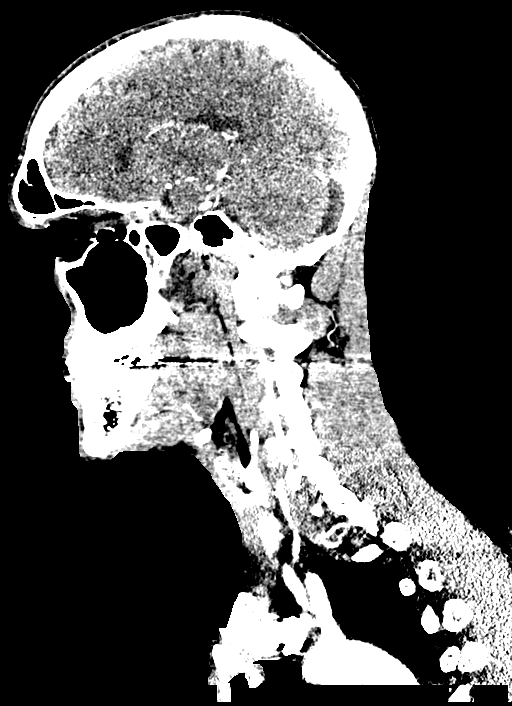

[14 of 47 positions shown; findings below may reference images not displayed]

FINDINGS: CTA NECK FINDINGS

Aortic arch: Normal

Right carotid system: Normal.  No bifurcation disease.

Left carotid system: Normal.  No bifurcation disease.

Vertebral arteries: Normal. No origin stenosis. No evidence of
dissection. Left vertebral artery arises directly from the arch.

Skeleton: Normal

Other neck: No mass or adenopathy.

Upper chest: Normal

Review of the MIP images confirms the above findings

CTA HEAD FINDINGS

Anterior circulation: Both internal carotid arteries widely patent
through the skull base and siphon regions. The anterior and middle
cerebral vessels are patent without proximal stenosis, aneurysm or
vascular malformation. No large or medium vessel occlusion.

Posterior circulation: Both vertebral arteries widely patent to the
basilar. No basilar stenosis. Posterior circulation branch vessels
are normal. Left PCA takes fetal origin from the anterior
circulation.

Venous sinuses: Patent and normal.

Anatomic variants: None significant.

Review of the MIP images confirms the above findings
IMPRESSION: Normal examination. No atherosclerotic disease. No large or medium
vessel occlusion.

These results were called by telephone at the time of interpretation
on 09/05/2020 at [DATE] to provider Dr. Hagos, who verbally
acknowledged these results.
